# Patient Record
Sex: Male | Born: 1985 | Hispanic: Yes | Marital: Single | Smoking: Never smoker
Health system: Southern US, Community
[De-identification: ages and names within clinical notes are randomized; demographics above are authoritative.]

---

## 2007-05-09 ENCOUNTER — Emergency Department: Payer: Self-pay | Admitting: Emergency Medicine

## 2008-07-17 ENCOUNTER — Emergency Department: Payer: Self-pay | Admitting: Emergency Medicine

## 2008-07-18 ENCOUNTER — Emergency Department: Payer: Self-pay | Admitting: Internal Medicine
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
## 2009-04-06 ENCOUNTER — Inpatient Hospital Stay: Payer: Self-pay | Admitting: Psychiatry

## 2013-07-10 ENCOUNTER — Emergency Department: Payer: Self-pay | Admitting: Emergency Medicine

## 2016-05-14 ENCOUNTER — Emergency Department
Admission: EM | Admit: 2016-05-14 | Discharge: 2016-05-14 | Disposition: A | Discharge status: Home / Self Care | Payer: Self-pay | Attending: Emergency Medicine | Admitting: Emergency Medicine

## 2016-05-14 DIAGNOSIS — K029 Dental caries, unspecified: Secondary | ICD-10-CM | POA: Insufficient documentation

## 2016-05-14 MED ORDER — AMOXICILLIN 500 MG PO CAPS: 500.0000 mg | ORAL_CAPSULE | Freq: Three times a day (TID) | ORAL | 0 refills | Status: AC

## 2016-05-14 MED ORDER — IBUPROFEN 600 MG PO TABS: 600.0000 mg | ORAL_TABLET | Freq: Three times a day (TID) | ORAL | 0 refills | Status: AC | PRN

## 2016-05-14 NOTE — ED Triage Notes (Signed)
Toothache to upper left side of mouth X 2 days. Pt has known cavity to that tooth. Took advil PTA with no relief.

## 2016-05-14 NOTE — ED Provider Notes (Signed)
[facility] Emergency Department Provider Note  ____________________________________________   First MD Initiated Contact with Patient 05/14/16 1229     (approximate)  I have reviewed the triage vital signs and the nursing notes.   HISTORY  Chief Complaint Dental Pain    HPI Duane Barnett is a 31 y.o. male is here with complaint of toothache to the upper left side of his mouth for the last 2 days. Patient states that he has a known cavity and was trying to pick some food out of the area prior to his increased dental pain. Patient denies any fever or chills. He has been taking over-the-counter medication for pain. Patient actually made an appointment with a dentist while sitting in the emergency room. He has an appointment in 2 days. Currently he rates his pain as a 9/10.   History reviewed. No pertinent past medical history.  There are no active problems to display for this patient.   History reviewed. No pertinent surgical history.  Prior to Admission medications   Medication Sig Start Date End Date Taking? Authorizing Provider  amoxicillin (AMOXIL) 500 MG capsule Take 1 capsule (500 mg total) by mouth 3 (three) times daily. 05/14/16   Tommi Rumps, PA-C  ibuprofen (ADVIL,MOTRIN) 600 MG tablet Take 1 tablet (600 mg total) by mouth every 8 (eight) hours as needed. 05/14/16   Tommi Rumps, PA-C    Allergies Patient has no known allergies.  No family history on file.  Social History Social History  Substance Use Topics  . Smoking status: Never Smoker  . Smokeless tobacco: Never Used  . Alcohol use Yes    Review of Systems Constitutional: No fever/chills ENT: No sore throat.Positive dental pain. Cardiovascular: Denies chest pain. Respiratory: Denies shortness of breath. Gastrointestinal: No abdominal pain.  No nausea, no vomiting.   Neurological: Negative for headaches  10-point ROS otherwise  negative.  ____________________________________________   PHYSICAL EXAM:  VITAL SIGNS: ED Triage Vitals  Enc Vitals Group     BP 05/14/16 1119 (!) 139/104     Pulse Rate 05/14/16 1119 74     Resp 05/14/16 1119 16     Temp 05/14/16 1119 98.2 F (36.8 C)     Temp Source 05/14/16 1119 Oral     SpO2 05/14/16 1119 97 %     Weight 05/14/16 1119 165 lb (74.8 kg)     Height 05/14/16 1119 5\' 8"  (1.727 m)     Head Circumference --      Peak Flow --      Pain Score 05/14/16 1120 9     Pain Loc --      Pain Edu? --      Excl. in GC? --     Constitutional: Alert and oriented. Well appearing and in no acute distress. Eyes: Conjunctivae are normal. PERRL. EOMI. Head: Atraumatic. Nose: No congestion/rhinnorhea. Mouth/Throat: Mucous membranes are moist.  Oropharynx non-erythematous. Upper left molar with large cavity present. There is tenderness on palpation of the area with a tongue depressor. There is no active drainage noted. Neck: No stridor.   Hematological/Lymphatic/Immunilogical: No cervical lymphadenopathy. Cardiovascular: Normal rate, regular rhythm. Grossly normal heart sounds.  Good peripheral circulation. Respiratory: Normal respiratory effort.  No retractions. Lungs CTAB. Musculoskeletal: Moves upper and lower extremities without difficulty. Normal gait was noted. Neurologic:  Normal speech and language. No gross focal neurologic deficits are appreciated. No gait instability. Skin:  Skin is warm, dry and intact. No rash noted. Psychiatric: Mood  and affect are normal. Speech and behavior are normal.  ____________________________________________   LABS (all labs ordered are listed, but only abnormal results are displayed)  Labs Reviewed - No data to display  PROCEDURES  Procedure(s) performed: None  Procedures  Critical Care performed: No  ____________________________________________   INITIAL IMPRESSION / ASSESSMENT AND PLAN / ED COURSE  Pertinent labs &  imaging results that were available during my care of the patient were reviewed by me and considered in my medical decision making (see chart for details).    Clinical Course    Patient will keep his appointment with the dentist in 2 days. He was started on amoxicillin 500 mg 3 times a day for 10 days. He is also given a prescription for ibuprofen as needed for pain. Blood pressure was rechecked and still elevated. Patient was made aware of this and he is to follow-[facility] with his primary care or [facility] for recheck of his blood pressure and reevaluation.  ____________________________________________   FINAL CLINICAL IMPRESSION(S) / ED DIAGNOSES  Final diagnoses:  Pain due to dental caries  Dental caries      NEW MEDICATIONS STARTED DURING THIS VISIT:  Discharge Medication List as of 05/14/2016  1:09 PM    START taking these medications   Details  amoxicillin (AMOXIL) 500 MG capsule Take 1 capsule (500 mg total) by mouth 3 (three) times daily., Starting Tue 05/14/2016, Print    ibuprofen (ADVIL,MOTRIN) 600 MG tablet Take 1 tablet (600 mg total) by mouth every 8 (eight) hours as needed., Starting Tue 05/14/2016, Print         Note:  This document was prepared using Dragon voice recognition software and may include unintentional dictation errors.    Tommi RumpsRhonda L Chakira Jachim, PA-C 05/14/16 1447    Sharman CheekPhillip Stafford, MD 05/18/16 1505

## 2016-05-14 NOTE — Discharge Instructions (Signed)
Keep the appointment with the dentist on Thursday. Begin taking amoxicillin 500 mg 3 times a day. Ibuprofen 600 mg every 8 hours as needed for pain. [facility] or doctor of your choice for recheck of your blood pressure. Today's blood pressure was elevated at 139/104.  If your blood pressure remains high and may need medication to help control this.
